# Patient Record
Sex: Female | Born: 2000 | Race: Black or African American | Hispanic: No | Marital: Single | Smoking: Never smoker
Health system: Southern US, Community
[De-identification: ages and names within clinical notes are randomized; demographics above are authoritative.]

---
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
## 2020-02-20 ENCOUNTER — Ambulatory Visit: Payer: No Typology Code available for payment source | Attending: Family

## 2020-02-20 DIAGNOSIS — Z23 Encounter for immunization: Secondary | ICD-10-CM

## 2020-05-05 NOTE — Progress Notes (Signed)
   Covid-19 Vaccination Clinic  Name:  Raiyah Speakman    MRN: [id number] DOB: 2001/03/17  05/05/2020  Ms. Luebke was observed post Covid-19 immunization for 15 minutes without incident. She was provided with Vaccine Information Sheet and instruction to access the V-Safe system.   Ms. Lippold was instructed to call 911 with any severe reactions post vaccine: Marland Kitchen Difficulty breathing  . Swelling of face and throat  . A fast heartbeat  . A bad rash all over body  . Dizziness and weakness   Immunizations Administered    Name Date Dose VIS Date Route   Moderna Covid-19 Booster Vaccine 02/20/2020  5:45 PM 0.25 mL 02/05/2020 Intramuscular   Manufacturer: Moderna   Lot: [id number]   NDC: 95093-267-12

## 2020-08-03 ENCOUNTER — Emergency Department (HOSPITAL_COMMUNITY): Payer: Managed Care, Other (non HMO)

## 2020-08-03 ENCOUNTER — Encounter (HOSPITAL_COMMUNITY): Payer: Self-pay | Admitting: Emergency Medicine

## 2020-08-03 ENCOUNTER — Emergency Department (HOSPITAL_COMMUNITY)
Admission: EM | Admit: 2020-08-03 | Discharge: 2020-08-03 | Disposition: A | Discharge status: Home / Self Care | Payer: Managed Care, Other (non HMO) | Attending: Emergency Medicine | Admitting: Emergency Medicine

## 2020-08-03 ENCOUNTER — Other Ambulatory Visit: Payer: Self-pay

## 2020-08-03 DIAGNOSIS — J1089 Influenza due to other identified influenza virus with other manifestations: Secondary | ICD-10-CM | POA: Diagnosis not present

## 2020-08-03 DIAGNOSIS — J111 Influenza due to unidentified influenza virus with other respiratory manifestations: Secondary | ICD-10-CM

## 2020-08-03 DIAGNOSIS — U071 COVID-19: Secondary | ICD-10-CM | POA: Diagnosis not present

## 2020-08-03 DIAGNOSIS — B349 Viral infection, unspecified: Secondary | ICD-10-CM

## 2020-08-03 DIAGNOSIS — R509 Fever, unspecified: Secondary | ICD-10-CM

## 2020-08-03 LAB — URINALYSIS, ROUTINE W REFLEX MICROSCOPIC
Bilirubin Urine: NEGATIVE
Glucose, UA: NEGATIVE mg/dL
Hgb urine dipstick: NEGATIVE
Ketones, ur: 80 mg/dL — AB
Leukocytes,Ua: NEGATIVE
Nitrite: NEGATIVE
Protein, ur: 30 mg/dL — AB
Specific Gravity, Urine: 1.027 (ref 1.005–1.030)
pH: 6 (ref 5.0–8.0)

## 2020-08-03 LAB — RESP PANEL BY RT-PCR (FLU A&B, COVID) ARPGX2
Influenza A by PCR: POSITIVE — AB
Influenza B by PCR: NEGATIVE
SARS Coronavirus 2 by RT PCR: POSITIVE — AB

## 2020-08-03 LAB — PREGNANCY, URINE: Preg Test, Ur: NEGATIVE

## 2020-08-03 MED ORDER — KETOROLAC TROMETHAMINE 15 MG/ML IJ SOLN
30.0000 mg | Freq: Once | INTRAMUSCULAR | Status: AC
  Administered 2020-08-03: 30 mg via INTRAMUSCULAR
  Filled 2020-08-03: qty 2

## 2020-08-03 MED ORDER — PROMETHAZINE-DM 6.25-15 MG/5ML PO SYRP: 5.0000 mL | ORAL_SOLUTION | Freq: Four times a day (QID) | ORAL | 0 refills | Status: AC | PRN

## 2020-08-03 MED ORDER — ONDANSETRON 4 MG PO TBDP: 4.0000 mg | ORAL_TABLET | Freq: Three times a day (TID) | ORAL | 0 refills | Status: AC | PRN

## 2020-08-03 MED ORDER — ACETAMINOPHEN 500 MG PO TABS
1000.0000 mg | ORAL_TABLET | Freq: Once | ORAL | Status: AC
  Administered 2020-08-03: 1000 mg via ORAL
  Filled 2020-08-03: qty 2

## 2020-08-03 MED ORDER — BENZONATATE 100 MG PO CAPS: 100.0000 mg | ORAL_CAPSULE | Freq: Three times a day (TID) | ORAL | 0 refills | Status: AC

## 2020-08-03 MED ORDER — METHOCARBAMOL 500 MG PO TABS: 500.0000 mg | ORAL_TABLET | Freq: Two times a day (BID) | ORAL | 0 refills | Status: DC

## 2020-08-03 NOTE — ED Provider Notes (Signed)
[facility] Provider Note   CSN: [id number] Arrival date & time: 08/03/20  0432     History Chief Complaint  Patient presents with  . Fever/Chills/Sore Throat/Cough    Lindsay Myers is a 20 y.o. female.  HPI Patient is a 20 year old female with no pertinent past medical history she is vaccinated x2 and has received booster.  For COVID-19.  She states that for the past 2 days she has had fevers and chills that are intermittent, sore throat, productive cough with some yellow sputum, no hemoptysis no chest pain or shortness of breath.  She states that she has also had a runny nose.  She states that the symptoms have been constant.  She states that she has been using Tylenol and ibuprofen with no significant relief.  She states that her fever returned shortly after taking Tylenol.  When asked for specific timeline she states 3 or 4 hours after these medications.  She denies any urinary frequency urgency dysuria hematuria or abdominal pain.  She denies any diarrhea or constipation.  She denies any chest pain or shortness of breath.  Denies any headache or neck stiffness.  Denies any sick contacts that she knows of.  No other associate symptoms.  No aggravating mitigating factors.  She also states that she has not received influenza vaccine.    History reviewed. No pertinent past medical history.  There are no problems to display for this patient.   History reviewed. No pertinent surgical history.   OB History   No obstetric history on file.     No family history on file.  Social History   Tobacco Use  . Smoking status: Never Smoker  . Smokeless tobacco: Never Used  Substance Use Topics  . Alcohol use: Never  . Drug use: Never    Home Medications Prior to Admission medications   Medication Sig Start Date End Date Taking? Authorizing Provider  benzonatate (TESSALON) 100 MG capsule Take 1 capsule (100 mg total) by mouth every 8 (eight)  hours. 08/03/20  Yes Osualdo Hansell S, PA  methocarbamol (ROBAXIN) 500 MG tablet Take 1 tablet (500 mg total) by mouth 2 (two) times daily. 08/03/20  Yes Khalel Alms S, PA  naproxen (NAPROSYN) 500 MG tablet Take 500 mg by mouth 2 (two) times daily as needed for mild pain.   Yes [provider]  ondansetron (ZOFRAN ODT) 4 MG disintegrating tablet Take 1 tablet (4 mg total) by mouth every 8 (eight) hours as needed for nausea or vomiting. 08/03/20  Yes Melessia Kaus S, PA  promethazine-dextromethorphan (PROMETHAZINE-DM) 6.25-15 MG/5ML syrup Take 5 mLs by mouth 4 (four) times daily as needed for cough. 08/03/20  Yes Vahan Wadsworth, Rodrigo RanWylder S, PA  Burr MedicoXULANE 150-35 MCG/24HR transdermal patch Place 1 patch onto the skin once a week. 06/21/20  Yes [provider]  buPROPion (WELLBUTRIN XL) 150 MG 24 hr tablet Take 1 tablet by mouth daily. Patient not taking: Reported on 08/03/2020 03/26/20   [provider]    Allergies    Patient has no known allergies.  Review of Systems   Review of Systems  Constitutional: Positive for chills, fatigue and fever.  HENT: Positive for congestion and rhinorrhea.   Eyes: Negative for pain.  Respiratory: Positive for cough. Negative for shortness of breath.   Cardiovascular: Negative for chest pain and leg swelling.  Gastrointestinal: Negative for abdominal pain, diarrhea, nausea and vomiting.  Genitourinary: Negative for dysuria, urgency, vaginal discharge and vaginal pain.  Musculoskeletal:  Positive for myalgias.  Skin: Negative for rash.  Neurological: Negative for dizziness and headaches.    Physical Exam Updated Vital Signs BP 111/65   Pulse (!) 125   Temp (!) 103.2 F (39.6 C) (Oral)   Resp 18   LMP 07/20/2020   SpO2 100%   Physical Exam Vitals and nursing note reviewed.  Constitutional:      General: She is not in acute distress.    Comments: Patient is somewhat ill-appearing 20 year old female does not appear to be in any acute  distress and is nontoxic-appearing but is warm to touch.  She is speaking in full sentences.  No increased work of breathing.  HENT:     Head: Normocephalic and atraumatic.     Nose: Nose normal.     Mouth/Throat:     Comments: Posterior pharynx with erythema.  Some cobblestoning.  Some posterior nasal drainage.  Tonsils are normal size.  No exudates. Eyes:     General: No scleral icterus. Cardiovascular:     Rate and Rhythm: Normal rate and regular rhythm.     Pulses: Normal pulses.     Heart sounds: Normal heart sounds.  Pulmonary:     Effort: Pulmonary effort is normal. No respiratory distress.     Breath sounds: No wheezing.     Comments: Lungs are clear to auscultation all fields.  No tachypnea.  Speaking in full sentences.  No accessory muscle usage. Abdominal:     Palpations: Abdomen is soft.     Tenderness: There is no abdominal tenderness. There is no guarding or rebound.     Comments: Abdomen is soft nontender.  No guarding or rebound.  No CVA tenderness  Musculoskeletal:     Cervical back: Normal range of motion.     Right lower leg: No edema.     Left lower leg: No edema.  Skin:    General: Skin is warm and dry.     Capillary Refill: Capillary refill takes less than 2 seconds.  Neurological:     Mental Status: She is alert. Mental status is at baseline.  Psychiatric:        Mood and Affect: Mood normal.        Behavior: Behavior normal.     ED Results / Procedures / Treatments   Labs (all labs ordered are listed, but only abnormal results are displayed) Labs Reviewed  RESP PANEL BY RT-PCR (FLU A&B, COVID) ARPGX2 - Abnormal; Notable for the following components:      Result Value   SARS Coronavirus 2 by RT PCR POSITIVE (*)    Influenza A by PCR POSITIVE (*)    All other components within normal limits  URINALYSIS, ROUTINE W REFLEX MICROSCOPIC - Abnormal; Notable for the following components:   APPearance HAZY (*)    Ketones, ur 80 (*)    Protein, ur 30 (*)     Bacteria, UA RARE (*)    All other components within normal limits  PREGNANCY, URINE    EKG None  Radiology DG Chest Port 1 View  Result Date: 08/03/2020 CLINICAL DATA:  Cough fever with chills EXAM: PORTABLE CHEST 1 VIEW COMPARISON:  None. FINDINGS: The heart size and mediastinal contours are within normal limits. Both lungs are clear. The visualized skeletal structures are unremarkable. IMPRESSION: No active disease. Electronically Signed   By: Marnee Spring M.D.   On: 08/03/2020 05:35    Procedures Procedures   Medications Ordered in ED Medications  acetaminophen (TYLENOL) tablet 1,000  mg (1,000 mg Oral Given 08/03/20 0507)  ketorolac (TORADOL) 15 MG/ML injection 30 mg (30 mg Intramuscular Given 08/03/20 2671)    ED Course  I have reviewed the triage vital signs and the nursing notes.  Pertinent labs & imaging results that were available during my care of the patient were reviewed by me and considered in my medical decision making (see chart for details).  Clinical Course as of 08/03/20 0700  Mon Aug 03, 2020  0453 2 days of fever, chills, st, cough, myalgias.  No abd, cp, sob. No LH dizziness.  [WF]  0546 Chest x-ray unremarkable.  No focal infiltrate. [WF]    Clinical Course User Index [WF] Gailen Shelter, PA   MDM Rules/Calculators/A&P                          Patient is 20 year old female with no pertinent past medical history presented today with fevers chills cough, congestion, myalgias, runny nose.  Ongoing for slightly over 2 days now.  Notably she is tachycardic, febrile.  She is overall very well-appearing.  Suspect viral illness.  PCR test shows the patient has influenza A and COVID.  Urinalysis unremarkable apart from some ketones likely secondary to decreased appetite and some starvation ketosis.  She is tolerating p.o. given food and p.o. fluid here in the ER.  She has not taken antipyretics for greater than 8 hours.  Given Tylenol and Toradol here in  the ER.    Patient feels significantly improved on my reassessment at 6:58 AM.  Will discharge home with Tylenol and ibuprofen recommendations as well as Zofran for any nausea she may experience, Robaxin for muscle aches, Promethazine DM and Tessalon Perles for cough.  Patient will follow up with PCP.  All questions answered best my ability.  Patient agreeable to plan.  Heart rate is improved to 110 on my reassessment.  Blood pressure 111/65.  Temperature 102.  Will discharge at this time.  Requested patient receive an additional 2 glasses of water prior to discharge.  I offered IV fluids which she declined.  Lindsay Myers was evaluated in Emergency Department on 08/03/2020 for the symptoms described in the history of present illness. She was evaluated in the context of the global COVID-19 pandemic, which necessitated consideration that the patient might be at risk for infection with the SARS-CoV-2 virus that causes COVID-19. Institutional protocols and algorithms that pertain to the evaluation of patients at risk for COVID-19 are in a state of rapid change based on information released by regulatory bodies including the CDC and federal and state organizations. These policies and algorithms were followed during the patient's care in the ED.   Final Clinical Impression(s) / ED Diagnoses Final diagnoses:  Fever, unspecified fever cause  Viral illness  COVID-19  Influenza    Rx / DC Orders ED Discharge Orders         Ordered    ondansetron (ZOFRAN ODT) 4 MG disintegrating tablet  Every 8 hours PRN        08/03/20 0647    methocarbamol (ROBAXIN) 500 MG tablet  2 times daily        08/03/20 0647    promethazine-dextromethorphan (PROMETHAZINE-DM) 6.25-15 MG/5ML syrup  4 times daily PRN        08/03/20 0647    benzonatate (TESSALON) 100 MG capsule  Every 8 hours        08/03/20 0647  Gailen Shelter, [place] 08/03/20 0700    Sabas Sous, MD 08/03/20 [phone]

## 2020-08-03 NOTE — ED Notes (Signed)
Pt provided more Ice water for oral hydration

## 2020-08-03 NOTE — Discharge Instructions (Addendum)
You are diagnosed today with COVID-19 as well as influenza A.  Please take Tylenol and ibuprofen around-the-clock as we discussed.  I have provided you with details on how to take this below.  Please use Tylenol or ibuprofen for pain.  You may use 600 mg ibuprofen every 6 hours or 1000 mg of Tylenol every 6 hours.  You may choose to alternate between the 2.  This would be most effective.  Not to exceed 4 g of Tylenol within 24 hours.  Not to exceed 3200 mg ibuprofen 24 hours.  I also provided for muscle relaxer called Robaxin.  Please drink lots of water.  I recommended least 8 glasses of water per day  You may always return to the ER for any new or concerning symptoms.  I anticipate they will feel well for approximately 1 week - 10 days.  However given that you are vaccinated I do not suspect that he will need to be hospitalized anytime.  Continue to monitor your symptoms and use Zofran as needed for nausea.  It is very important to eat plenty of food this will help you regain her health more quickly.

## 2020-08-03 NOTE — ED Triage Notes (Signed)
Patient reports fever with chills, sore throat , productive cough and rhinorrhea onset this week .

## 2020-08-04 ENCOUNTER — Telehealth: Payer: Self-pay | Admitting: Oncology

## 2020-08-04 NOTE — Telephone Encounter (Signed)
Called to discuss with patient about Covid symptoms and the use of a monoclonal antibody infusion for those with mild to moderate Covid symptoms and at a high risk of hospitalization.   Patient does not qualify for treatment for oral or IV antiviral or MAB based on symptoms and PMH.   No past medical history on file.   Mauro Kaufmann, NP  08/04/2020 12:19 PM

## 2021-09-15 ENCOUNTER — Emergency Department (HOSPITAL_COMMUNITY)
Admission: EM | Admit: 2021-09-15 | Discharge: 2021-09-15 | Disposition: A | Discharge status: Home / Self Care | Payer: Managed Care, Other (non HMO) | Attending: Emergency Medicine | Admitting: Emergency Medicine

## 2021-09-15 ENCOUNTER — Other Ambulatory Visit: Payer: Self-pay

## 2021-09-15 DIAGNOSIS — X58XXXA Exposure to other specified factors, initial encounter: Secondary | ICD-10-CM | POA: Insufficient documentation

## 2021-09-15 DIAGNOSIS — S161XXA Strain of muscle, fascia and tendon at neck level, initial encounter: Secondary | ICD-10-CM | POA: Diagnosis not present

## 2021-09-15 DIAGNOSIS — S199XXA Unspecified injury of neck, initial encounter: Secondary | ICD-10-CM | POA: Diagnosis present

## 2021-09-15 MED ORDER — NAPROXEN 500 MG PO TABS: 500.0000 mg | ORAL_TABLET | Freq: Two times a day (BID) | ORAL | 0 refills | Status: AC

## 2021-09-15 MED ORDER — KETOROLAC TROMETHAMINE 30 MG/ML IJ SOLN
30.0000 mg | Freq: Once | INTRAMUSCULAR | Status: AC
  Administered 2021-09-15: 30 mg via INTRAMUSCULAR
  Filled 2021-09-15: qty 1

## 2021-09-15 MED ORDER — METHOCARBAMOL 500 MG PO TABS: 500.0000 mg | ORAL_TABLET | Freq: Two times a day (BID) | ORAL | 0 refills | Status: AC

## 2021-09-15 MED ORDER — METHOCARBAMOL 500 MG PO TABS
500.0000 mg | ORAL_TABLET | Freq: Once | ORAL | Status: AC
  Administered 2021-09-15: 500 mg via ORAL
  Filled 2021-09-15: qty 1

## 2021-09-15 NOTE — ED Triage Notes (Signed)
Pt here for sharp L side neck pain that she woke up with yesterday, pt reports increased pain when trying to look to the left or looking down. Pt denies any injury

## 2021-09-15 NOTE — Discharge Instructions (Addendum)
I have prescribed you Naproxen and Robaxin for your symptoms. Please take these as prescribed. You were given a prescription for Robaxin which is a muscle relaxer.  You should not drive, work, consume alcohol, or operate machinery while taking this medication as it can make you very drowsy.   Please schedule a follow up appointment with your PCP or Orthopedics.

## 2021-09-15 NOTE — ED Provider Notes (Signed)
[facility] EMERGENCY DEPARTMENT Provider Note   CSN: [id number] Arrival date & time: 09/15/21  1118     History PMH: n/a Chief Complaint  Patient presents with   Neck Pain    Lindsay Myers is a 21 y.o. female. Patient presents to the emergency department the chief complaint of left lateral neck pain.  She says yesterday she woke up from her sleep with neck stiffness.  She says is often times happens when she wakes up where she is not unable to move her head in all directions, but it usually gets better on its own.  She states symptoms have been constant.  She has difficulty turning her head to the left without having pain.  She has tried Tylenol without relief.  Denies any chest pain, back pain, or shortness of breath.  She denies any injury to her neck.   Neck Pain     Home Medications Prior to Admission medications   Medication Sig Start Date End Date Taking? Authorizing Provider  methocarbamol (ROBAXIN) 500 MG tablet Take 1 tablet (500 mg total) by mouth 2 (two) times daily for 7 days. 09/15/21 09/22/21 Yes Raschelle Wisenbaker, Finis Bud, PA-C  naproxen (NAPROSYN) 500 MG tablet Take 1 tablet (500 mg total) by mouth 2 (two) times daily for 7 days. 09/15/21 09/22/21 Yes Chas Axel, Finis Bud, PA-C  benzonatate (TESSALON) 100 MG capsule Take 1 capsule (100 mg total) by mouth every 8 (eight) hours. 08/03/20   Gailen Shelter, PA  buPROPion (WELLBUTRIN XL) 150 MG 24 hr tablet Take 1 tablet by mouth daily. Patient not taking: Reported on 08/03/2020 03/26/20   [provider]  ondansetron (ZOFRAN ODT) 4 MG disintegrating tablet Take 1 tablet (4 mg total) by mouth every 8 (eight) hours as needed for nausea or vomiting. 08/03/20   Gailen Shelter, PA  promethazine-dextromethorphan (PROMETHAZINE-DM) 6.25-15 MG/5ML syrup Take 5 mLs by mouth 4 (four) times daily as needed for cough. 08/03/20   Gailen Shelter, PA  Burr Medico 150-35 MCG/24HR transdermal patch Place 1 patch onto the skin once a  week. 06/21/20   [provider]      Allergies    Patient has no known allergies.    Review of Systems   Review of Systems  Musculoskeletal:  Positive for neck pain.  All other systems reviewed and are negative.  Physical Exam Updated Vital Signs BP 113/72 (BP Location: Right Arm)   Pulse 74   Temp 97.7 F (36.5 C) (Oral)   Resp 18   LMP 08/09/2021 (Approximate)   SpO2 100%  Physical Exam Vitals and nursing note reviewed.  Constitutional:      General: She is not in acute distress.    Appearance: Normal appearance. She is well-developed. She is not ill-appearing, toxic-appearing or diaphoretic.  HENT:     Head: Normocephalic and atraumatic.     Nose: No nasal deformity.     Mouth/Throat:     Lips: Pink. No lesions.  Eyes:     General: Gaze aligned appropriately. No scleral icterus.       Right eye: No discharge.        Left eye: No discharge.     Conjunctiva/sclera: Conjunctivae normal.     Right eye: Right conjunctiva is not injected. No exudate or hemorrhage.    Left eye: Left conjunctiva is not injected. No exudate or hemorrhage. Pulmonary:     Effort: Pulmonary effort is normal. No respiratory distress.  Musculoskeletal:     Comments:  Is no midline C-spine tenderness or step-offs noted.  There is reproducible muscular tenderness on the left anterior lateral neck.  She has range of motion fully when turning to the right but has only partial range of motion when turning to the left.  There is no tenderness of the clavicles or anterior chest wall.  Skin:    General: Skin is warm and dry.  Neurological:     Mental Status: She is alert and oriented to person, place, and time.  Psychiatric:        Mood and Affect: Mood normal.        Speech: Speech normal.        Behavior: Behavior normal. Behavior is cooperative.    ED Results / Procedures / Treatments   Labs (all labs ordered are listed, but only abnormal results are displayed) Labs Reviewed - No data to  display  EKG None  Radiology No results found.  Procedures Procedures    Medications Ordered in ED Medications  ketorolac (TORADOL) 30 MG/ML injection 30 mg (30 mg Intramuscular Given 09/15/21 1237)  methocarbamol (ROBAXIN) tablet 500 mg (500 mg Oral Given 09/15/21 1237)    ED Course/ Medical Decision Making/ A&P                           Medical Decision Making Risk Prescription drug management.   Patient presents with left-sided neck pain. Not technically torticollis as she has lateral movement.  Seems consistent with muscular spasm. Do not think any imaging is indicated. Will give Toradol and Robaxin here.   Symptoms improved after treatment. Recommend continuing treatment at home. Will prescribe NSAIDS and muscle relaxer. F/u with PCP or ortho.   Final Clinical Impression(s) / ED Diagnoses Final diagnoses:  Acute strain of neck muscle, initial encounter    Rx / DC Orders ED Discharge Orders          Ordered    naproxen (NAPROSYN) 500 MG tablet  2 times daily        09/15/21 1342    methocarbamol (ROBAXIN) 500 MG tablet  2 times daily        09/15/21 1342              Jehad Bisono, Finis Bud, PA-C 09/15/21 1613    Terald Sleeper, MD 09/16/21 1032

## 2022-07-23 IMAGING — DX DG CHEST 1V PORT
1 series · 1 of 1 positions shown · non-contrast
Comparison: None.

CLINICAL DATA: Cough fever with chills

EXAM:
PORTABLE CHEST 1 VIEW

[chest ap]
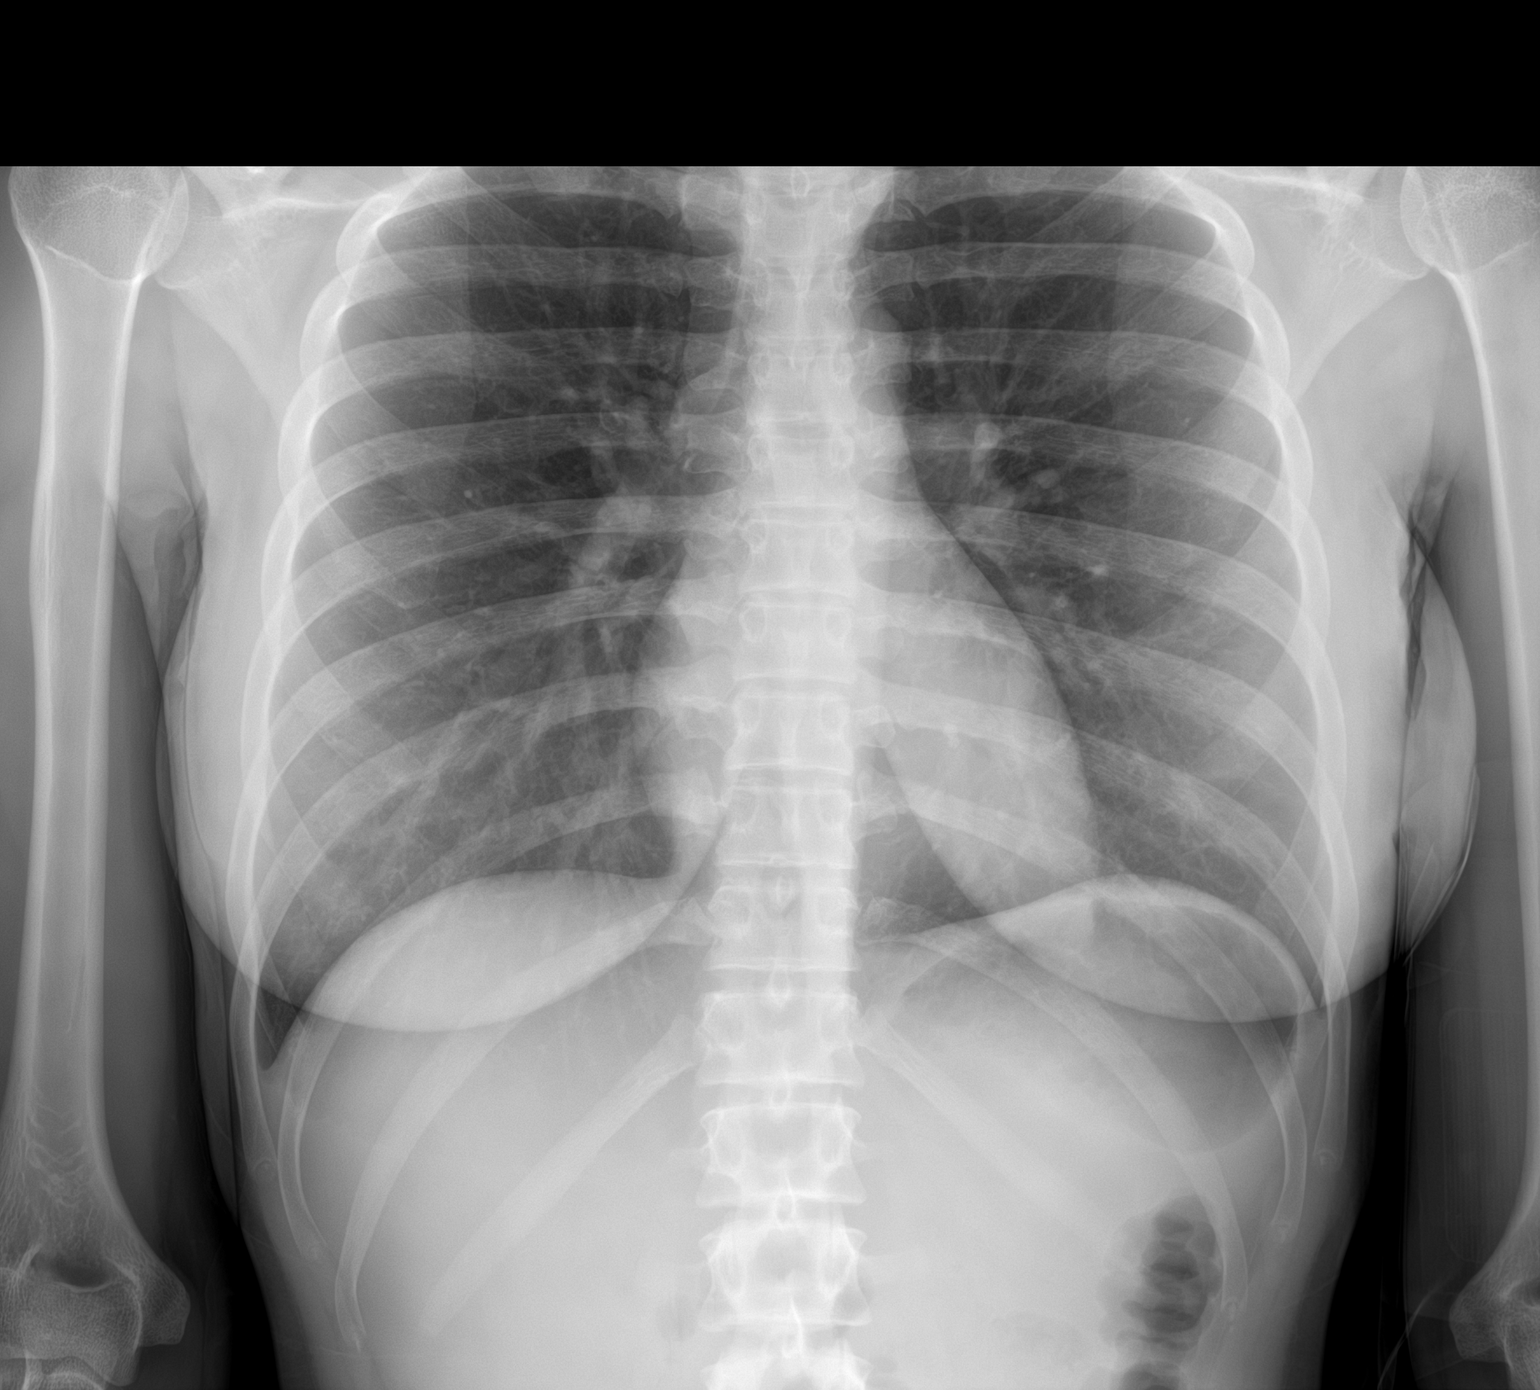

[1 of 1 positions shown; findings below may reference images not displayed]

FINDINGS: The heart size and mediastinal contours are within normal limits.
Both lungs are clear. The visualized skeletal structures are
unremarkable.
IMPRESSION: No active disease.
